# Patient Record
Sex: Male | Born: 1995 | Hispanic: No | Marital: Single | Smoking: Never smoker
Health system: Southern US, Community
[De-identification: ages and names within clinical notes are randomized; demographics above are authoritative.]

## PROBLEM LIST (undated history)

## (undated) DIAGNOSIS — S83241S Other tear of medial meniscus, current injury, right knee, sequela: Secondary | ICD-10-CM

## (undated) DIAGNOSIS — G8929 Other chronic pain: Secondary | ICD-10-CM

## (undated) DIAGNOSIS — M545 Low back pain, unspecified: Secondary | ICD-10-CM

## (undated) DIAGNOSIS — M25561 Pain in right knee: Secondary | ICD-10-CM

## (undated) DIAGNOSIS — S32059A Unspecified fracture of fifth lumbar vertebra, initial encounter for closed fracture: Secondary | ICD-10-CM

## (undated) DIAGNOSIS — M4300 Spondylolysis, site unspecified: Secondary | ICD-10-CM

## (undated) DIAGNOSIS — J45909 Unspecified asthma, uncomplicated: Secondary | ICD-10-CM

## (undated) HISTORY — DX: Other tear of medial meniscus, current injury, right knee, sequela: S83.241S

## (undated) HISTORY — DX: Unspecified fracture of fifth lumbar vertebra, initial encounter for closed fracture: S32.059A

## (undated) HISTORY — DX: Low back pain, unspecified: M54.50

## (undated) HISTORY — DX: Spondylolysis, site unspecified: M43.00

## (undated) HISTORY — DX: Pain in right knee: M25.561

## (undated) HISTORY — PX: KNEE SURGERY: SHX244

## (undated) HISTORY — DX: Other chronic pain: G89.29

## (undated) HISTORY — DX: Low back pain: M54.5

---
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
## 2017-02-22 ENCOUNTER — Ambulatory Visit (INDEPENDENT_AMBULATORY_CARE_PROVIDER_SITE_OTHER): Payer: Commercial Managed Care - PPO

## 2017-02-22 ENCOUNTER — Ambulatory Visit: Payer: Commercial Managed Care - PPO | Admitting: Physician Assistant

## 2017-02-22 ENCOUNTER — Encounter (INDEPENDENT_AMBULATORY_CARE_PROVIDER_SITE_OTHER): Payer: Self-pay

## 2017-02-22 ENCOUNTER — Encounter: Payer: Self-pay | Admitting: Physician Assistant

## 2017-02-22 VITALS — BP 119/76 | HR 58 | Resp 16 | Ht 69.0 in | Wt 203.0 lb

## 2017-02-22 DIAGNOSIS — Z7689 Persons encountering health services in other specified circumstances: Secondary | ICD-10-CM | POA: Diagnosis not present

## 2017-02-22 DIAGNOSIS — Z130 Encounter for screening for diseases of the blood and blood-forming organs and certain disorders involving the immune mechanism: Secondary | ICD-10-CM | POA: Diagnosis not present

## 2017-02-22 DIAGNOSIS — M25561 Pain in right knee: Secondary | ICD-10-CM | POA: Diagnosis not present

## 2017-02-22 DIAGNOSIS — Z87828 Personal history of other (healed) physical injury and trauma: Secondary | ICD-10-CM

## 2017-02-22 DIAGNOSIS — Z8781 Personal history of (healed) traumatic fracture: Secondary | ICD-10-CM

## 2017-02-22 DIAGNOSIS — G8929 Other chronic pain: Secondary | ICD-10-CM | POA: Insufficient documentation

## 2017-02-22 DIAGNOSIS — M545 Low back pain, unspecified: Secondary | ICD-10-CM | POA: Insufficient documentation

## 2017-02-22 DIAGNOSIS — Z131 Encounter for screening for diabetes mellitus: Secondary | ICD-10-CM

## 2017-02-22 DIAGNOSIS — Z13220 Encounter for screening for lipoid disorders: Secondary | ICD-10-CM

## 2017-02-22 DIAGNOSIS — Z8709 Personal history of other diseases of the respiratory system: Secondary | ICD-10-CM | POA: Diagnosis not present

## 2017-02-22 DIAGNOSIS — Z9889 Other specified postprocedural states: Secondary | ICD-10-CM | POA: Insufficient documentation

## 2017-02-22 DIAGNOSIS — Z23 Encounter for immunization: Secondary | ICD-10-CM | POA: Diagnosis not present

## 2017-02-22 DIAGNOSIS — M4300 Spondylolysis, site unspecified: Secondary | ICD-10-CM

## 2017-02-22 HISTORY — DX: Spondylolysis, site unspecified: M43.00

## 2017-02-22 HISTORY — DX: Other chronic pain: G89.29

## 2017-02-22 NOTE — Progress Notes (Signed)
HPI:                                                                Jack Schmitt is a 22 y.o. male who presents to [facility]: Primary Care Sports Medicine today to establish care  Current concerns: right knee pain  22 yo M with PMH of right ACL reconstruction (2014) and right synovectomy and medial meniscectomy (2015) presents with recurrent right anterior knee pain. Endorses occasional clicking and aching pain, mild, intermittent. Pain is worse when squatting free weights at the gym (approx. 185 pounds). Describes "feels like bone wants to pop out the front of both of knees."  Denies swelling/effusion or knee instability.  Has not tried any NSAID or treatment.  Depression screen PHQ 2/9 02/22/2017  Decreased Interest 0  Down, Depressed, Hopeless 0  PHQ - 2 Score 0    No flowsheet data found.    Past Medical History:  Diagnosis Date  . Acute medial meniscal tear, right, sequela   . Asthma   . Chronic low back pain   . L5 vertebral fracture [facility])    Past Surgical History:  Procedure Laterality Date  . KNEE ARTHROSCOPY WITH ANTERIOR CRUCIATE LIGAMENT (ACL) REPAIR Right 2014  . KNEE SURGERY     x 2    Social History   Tobacco Use  . Smoking status: Never Smoker  . Smokeless tobacco: Never Used  Substance Use Topics  . Alcohol use: No   family history includes HIV in his maternal grandfather.    ROS: negative except as noted in the HPI  Medications: No current outpatient medications on file.   No current facility-administered medications for this visit.    No Known Allergies     Objective:  BP 119/76   Pulse (!) 58   Resp 16   Ht 5\' 9"  (1.753 m)   Wt 203 lb (92.1 kg)   BMI 29.98 kg/m  Gen:  alert, not ill-appearing, no distress, appropriate for age, athletic build HEENT: head normocephalic without obvious abnormality, conjunctiva and cornea clear, trachea midline Pulm: Normal work of breathing, normal phonation Neuro: alert and  oriented x 3, no tremor MSK: right knee atraumatic, no effusion or edema, no crepitus, full active ROM, joint is stable; extremities atraumatic, normal gait and station Skin: intact, no rashes on exposed skin, no jaundice, no cyanosis Psych: well-groomed, cooperative, good eye contact, euthymic mood, affect mood-congruent, speech is articulate, and thought processes clear and goal-directed    No results found for this or any previous visit (from the past 72 hour(s)). No results found.    Assessment and Plan: 22 y.o. male with   1. Encounter to establish care - reviewed PMH, PSH, PFH, medications and allergies - reviewed health maintenance - Tdap due in March - influenza given in office today - negative depression screen  2. Chronic bilateral low back pain without sciatica - DG Lumbar Spine Complete  3. Chronic patellofemoral pain of right knee - rehab exercises daily - Naproxen prn for moderate pain - Ice/Heat - rest and avoid heavy lifting - follow-up with Sports Medicine - DG Knee Complete 4 Views Right; Future - DG Knee 1-2 Views Left; Future  4. History of asthma - asymptomatic for 10 years or more  5. Screening for blood disease - CBC  6. Screening for diabetes mellitus - Comprehensive metabolic panel  7. Screening for lipid disorders - Lipid Panel w/reflex Direct LDL  8. History of fracture of vertebra - reports L5 fracture in high school from a football injury  9. S/P ACL repair (right)   11. Need for immunization against influenza - Flu Vaccine QUAD 36+ mos IM     Patient education and anticipatory guidance given Patient agrees with treatment plan Follow-up as needed if symptoms worsen or fail to improve  Levonne Hubertharley E. Denzil Bristol PA-C

## 2017-02-22 NOTE — Patient Instructions (Addendum)
- Naproxen (Aleve) 1-2 capsules twice a day as needed for moderate pain - Ice x 20 minutes every 4 hours as needed - Follow-up with Sports Medicine  I have also ordered fasting labs. The lab is a walk-in open M-F 7:30a-4:30p (closed 12:30-1:30p). Nothing to eat or drink after midnight or at least 8 hours before your blood draw. You can have water and your medications.    Knee Pain, Adult Knee pain in adults is common. It can be caused by many things, including:  Arthritis.  A fluid-filled sac (cyst) or growth in your knee.  An infection in your knee.  An injury that will not heal.  Damage, swelling, or irritation of the tissues that support your knee.  Knee pain is usually not a sign of a serious problem. The pain may go away on its own with time and rest. If it does not, a health care provider may order tests to find the cause of the pain. These may include:  Imaging tests, such as an X-ray, MRI, or ultrasound.  Joint aspiration. In this test, fluid is removed from the knee.  Arthroscopy. In this test, a lighted tube is inserted into knee and an image is projected onto a TV screen.  A biopsy. In this test, a sample of tissue is removed from the body and studied under a microscope.  Follow these instructions at home: Pay attention to any changes in your symptoms. Take these actions to relieve your pain. Activity  Rest your knee.  Do not do things that cause pain or make pain worse.  Avoid high-impact activities or exercises, such as running, jumping rope, or doing jumping jacks. General instructions  Take over-the-counter and prescription medicines only as told by your health care provider.  Raise (elevate) your knee above the level of your heart when you are sitting or lying down.  Sleep with a pillow under your knee.  If directed, apply ice to the knee: ? Put ice in a plastic bag. ? Place a towel between your skin and the bag. ? Leave the ice on for 20 minutes, 2-3  times a day.  Ask your health care provider if you should wear an elastic knee support.  Lose weight if you are overweight. Extra weight can put pressure on your knee.  Do not use any products that contain nicotine or tobacco, such as cigarettes and e-cigarettes. Smoking may slow the healing of any bone and joint problems that you may have. If you need help quitting, ask your health care provider. Contact a health care provider if:  Your knee pain continues, changes, or gets worse.  You have a fever along with knee pain.  Your knee buckles or locks up.  Your knee swells, and the swelling becomes worse. Get help right away if:  Your knee feels warm to the touch.  You cannot move your knee.  You have severe pain in your knee.  You have chest pain.  You have trouble breathing. Summary  Knee pain in adults is common. It can be caused by many things, including, arthritis, infection, cysts, or injury.  Knee pain is usually not a sign of a serious problem, but if it does not go away, a health care provider may perform tests to know the cause of the pain.  Pay attention to any changes in your symptoms. Relieve your pain with rest, medicines, light activity, and use of ice.  Get help if your pain continues or becomes very severe, or  if your knee buckles or locks up, or if you have chest pain or trouble breathing. This information is not intended to replace advice given to you by your health care provider. Make sure you discuss any questions you have with your health care provider. Document Released: 10/29/2006 Document Revised: 12/23/2015 Document Reviewed: 12/23/2015 Elsevier Interactive Patient Education  [employer].

## 2017-02-24 ENCOUNTER — Encounter: Payer: Self-pay | Admitting: Physician Assistant

## 2017-02-24 NOTE — Progress Notes (Signed)
Jack NovemberMike,  Your knee x-ray looks great.  You have a pars defect at L5, which is a stress fracture. This is the source of your low back pain.  I would definitely avoid dead lifts, squats and any weight lifting where you are extending/hyper-extending your back. Avoid contact sports. Keep your follow-up with Sports Medicine.  Best, Vinetta Bergamoharley

## 2017-03-01 ENCOUNTER — Ambulatory Visit (INDEPENDENT_AMBULATORY_CARE_PROVIDER_SITE_OTHER): Payer: Commercial Managed Care - PPO | Admitting: Sports Medicine

## 2017-03-01 ENCOUNTER — Encounter: Payer: Self-pay | Admitting: Sports Medicine

## 2017-03-01 DIAGNOSIS — M25561 Pain in right knee: Secondary | ICD-10-CM

## 2017-03-01 DIAGNOSIS — Z9889 Other specified postprocedural states: Secondary | ICD-10-CM

## 2017-03-01 MED ORDER — IBUPROFEN 800 MG PO TABS: 800.0000 mg | ORAL_TABLET | Freq: Three times a day (TID) | ORAL | 2 refills | Status: DC | PRN

## 2017-03-01 NOTE — Progress Notes (Signed)
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  He was pain-free after that, more recently he is noted insidious onset of pain, anterior medially, moderate, persistent without radiation, no mechanical symptoms, no history of trauma, really no swelling.  He does note a sensation of joint instability.  I reviewed the past medical history, family history, social history, surgical history, and allergies today and no changes were needed.  Please see the problem list section below in epic for further details.  Past Medical History: Past Medical History:  Diagnosis Date  . Acute medial meniscal tear, right, sequela   . Asthma   . Chronic low back pain   . Chronic patellofemoral pain of right knee 02/22/2017  . L5 vertebral fracture (HCC)   . 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 KNEE ARTHROSCOPY WITH ANTERIOR CRUCIATE LIGAMENT (ACL) REPAIR Right 2014  . KNEE SURGERY     x 2    Social History: Social History   Socioeconomic History  . Marital status: Single    Spouse name: None  . Number of children: None  . Years of education: None  . Highest education level: None  Social Needs  . Financial resource strain: None  . Food insecurity - worry: None  . Food insecurity - inability: None  . Transportation needs - medical: None  . Transportation needs - non-medical: None  Occupational History  . None  Tobacco Use  . Smoking status: Never Smoker  . Smokeless tobacco: Never Used  Substance and Sexual Activity  . Alcohol use: No  . Drug use: Yes    Frequency: 7.0 times per week    Types:  Marijuana  . Sexual activity: None  Other Topics Concern  . None  Social History Narrative  . None   Family History: Family History  Problem Relation Age of Onset  . HIV Maternal Grandfather   . Diabetes Maternal Grandfather   . Valvular heart disease Maternal Grandfather   . Hypertension Maternal Grandfather   . Hyperlipidemia Paternal Grandfather   . Hypertension Paternal Grandfather    Allergies: No Known Allergies Medications: See med rec.  Review of Systems: No headache, visual changes, nausea, vomiting, diarrhea, constipation, dizziness, abdominal pain, skin rash, fevers, chills, night sweats, weight loss, swollen lymph nodes, body aches, joint swelling, muscle aches, chest pain, shortness of breath, mood changes, visual or auditory hallucinations.   Objective:   General: Well Developed, well nourished, and in no acute distress.  Neuro:  Extra-ocular muscles intact, able to move all 4 extremities, sensation grossly intact.  Deep tendon reflexes tested were normal. Psych: Alert and oriented, mood congruent with affect. ENT:  Ears and nose appear unremarkable.  Hearing grossly normal. Neck: Unremarkable overall appearance, trachea midline.  No visible thyroid enlargement. Eyes: Conjunctivae and lids appear unremarkable.  Pupils equal and round. Skin: Warm and dry, no rashes noted.  Cardiovascular: Pulses palpable, no extremity edema. Right knee: Overall unremarkable to inspection, well-healed arthroscopy portals ROM normal in flexion and extension and lower leg rotation. Good endpoints to the PCL, LCL, MCL, I am unable to appreciate the  ACL endpoint, positive Lachman's test, positive anterior drawer sign. No pain with terminal flexion. Negative Mcmurray's and provocative meniscal tests. Non painful patellar compression. Patellar and quadriceps tendons unremarkable. Hamstring and quadriceps strength is normal.  X-rays reviewed and are unremarkable with the exception of  classic signs of ACL reconstruction anchors.  Impression and Recommendations:   This case required medical decision making of moderate complexity.  S/P ACL repair History of ACL reconstruction with hamstring autograft. This was followed by partial meniscectomy in 2015. Unfortunately has persistent pain at the anterior medial aspect of the joint as well as a sensation of joint instability, I am unable to adequately appreciate his ACL. Because of this it is crucial that we determine if his graft is still intact before proceeding any further. Adding ibuprofen 800 in the meantime. X-rays are unremarkable with the exception of the signs of an old ACL reconstruction. ___________________________________________ Ihor Austinhomas J. Benjamin Stainhekkekandam, M.D., ABFM., CAQSM. Primary Care and Sports Medicine [facility]  Adjunct Instructor of Family Medicine  [employer]

## 2017-03-01 NOTE — Assessment & Plan Note (Signed)
History of ACL reconstruction with hamstring autograft. This was followed by partial meniscectomy in 2015. Unfortunately has persistent pain at the anterior medial aspect of the joint as well as a sensation of joint instability, I am unable to adequately appreciate his ACL. Because of this it is crucial that we determine if his graft is still intact before proceeding any further. Adding ibuprofen 800 in the meantime. X-rays are unremarkable with the exception of the signs of an old ACL reconstruction.

## 2017-03-11 ENCOUNTER — Ambulatory Visit (INDEPENDENT_AMBULATORY_CARE_PROVIDER_SITE_OTHER): Payer: Commercial Managed Care - PPO

## 2017-03-11 DIAGNOSIS — Z9889 Other specified postprocedural states: Secondary | ICD-10-CM

## 2017-03-11 DIAGNOSIS — Y93B3 Activity, free weights: Secondary | ICD-10-CM | POA: Diagnosis not present

## 2017-03-11 DIAGNOSIS — X500XXD Overexertion from strenuous movement or load, subsequent encounter: Secondary | ICD-10-CM | POA: Diagnosis not present

## 2017-03-11 DIAGNOSIS — S83231D Complex tear of medial meniscus, current injury, right knee, subsequent encounter: Secondary | ICD-10-CM | POA: Diagnosis not present

## 2017-03-11 DIAGNOSIS — M25561 Pain in right knee: Secondary | ICD-10-CM

## 2017-03-11 LAB — COMPREHENSIVE METABOLIC PANEL
AG RATIO: 1.7 (calc) (ref 1.0–2.5)
ALKALINE PHOSPHATASE (APISO): 40 U/L (ref 40–115)
ALT: 25 U/L (ref 9–46)
AST: 19 U/L (ref 10–40)
Albumin: 4.6 g/dL (ref 3.6–5.1)
BILIRUBIN TOTAL: 0.5 mg/dL (ref 0.2–1.2)
BUN: 13 mg/dL (ref 7–25)
CALCIUM: 9.9 mg/dL (ref 8.6–10.3)
CO2: 28 mmol/L (ref 20–32)
Chloride: 101 mmol/L (ref 98–110)
Creat: 1.14 mg/dL (ref 0.60–1.35)
Globulin: 2.7 g/dL (calc) (ref 1.9–3.7)
Glucose, Bld: 109 mg/dL — ABNORMAL HIGH (ref 65–99)
POTASSIUM: 3.8 mmol/L (ref 3.5–5.3)
Sodium: 138 mmol/L (ref 135–146)
Total Protein: 7.3 g/dL (ref 6.1–8.1)

## 2017-03-11 LAB — CBC
HEMATOCRIT: 45.2 % (ref 38.5–50.0)
HEMOGLOBIN: 15.7 g/dL (ref 13.2–17.1)
MCH: 30.9 pg (ref 27.0–33.0)
MCHC: 34.7 g/dL (ref 32.0–36.0)
MCV: 89 fL (ref 80.0–100.0)
MPV: 10.1 fL (ref 7.5–12.5)
Platelets: 202 10*3/uL (ref 140–400)
RBC: 5.08 10*6/uL (ref 4.20–5.80)
RDW: 12 % (ref 11.0–15.0)
WBC: 5.7 10*3/uL (ref 3.8–10.8)

## 2017-03-11 LAB — LIPID PANEL W/REFLEX DIRECT LDL
CHOLESTEROL: 166 mg/dL (ref <200)
HDL: 48 mg/dL (ref >40)
LDL Cholesterol (Calc): 104 mg/dL (calc) — ABNORMAL HIGH
Non-HDL Cholesterol (Calc): 118 mg/dL (calc) (ref <130)
Total CHOL/HDL Ratio: 3.5 (calc) (ref <5.0)
Triglycerides: 57 mg/dL (ref <150)

## 2017-03-12 NOTE — Progress Notes (Signed)
Hi Jack Schmitt,  Your labs look good overall - normal kidney function and liver enzymes - cholesterol in a healthy range - normal blood counts  Your blood sugar was mildly elevated. If you were not fasting, we would expect this. We should recheck a fasting glucose at your next routine follow-up appointment or annual physical. Otherwise, I am not concerned about it. It is not in a diabetic range.  Best, Vinetta Bergamoharley

## 2017-03-19 ENCOUNTER — Encounter: Payer: Self-pay | Admitting: Sports Medicine

## 2017-03-19 ENCOUNTER — Ambulatory Visit (INDEPENDENT_AMBULATORY_CARE_PROVIDER_SITE_OTHER): Payer: Commercial Managed Care - PPO | Admitting: Sports Medicine

## 2017-03-19 DIAGNOSIS — Z9889 Other specified postprocedural states: Secondary | ICD-10-CM

## 2017-03-19 NOTE — Progress Notes (Signed)
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  He did have some meniscal tearing.  After use of ibuprofen for a couple of weeks his symptoms have improved considerably, he is not sensing any more pain or instability, but he has not yet been back into the gym.  I reviewed the past medical history, family history, social history, surgical history, and allergies today and no changes were needed.  Please see the problem list section below in epic for further details.  Past Medical History: Past Medical History:  Diagnosis Date  . Acute medial meniscal tear, right, sequela   . Asthma   . Chronic low back pain   . Chronic patellofemoral pain of right knee 02/22/2017  . L5 vertebral fracture (HCC)   . 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 KNEE ARTHROSCOPY WITH ANTERIOR CRUCIATE LIGAMENT (ACL) REPAIR Right 2014  . KNEE SURGERY     x 2    Social History: Social History   Socioeconomic History  . Marital status: Single    Spouse name: None  . Number of children: None  . Years of education: None  . Highest education level: None  Social Needs  . Financial resource strain: None  . Food insecurity - worry: None  . Food insecurity - inability: None  . Transportation needs - medical: None  . Transportation needs - non-medical: None  Occupational History  . None  Tobacco Use  . Smoking status: Never Smoker  . Smokeless tobacco: Never Used  Substance and Sexual Activity  . Alcohol use: No  . Drug use: Yes    Frequency: 7.0 times per week    Types: Marijuana  . Sexual activity: None  Other Topics Concern  . None  Social History Narrative  . None   Family History: Family History  Problem Relation Age of Onset  .  HIV Maternal Grandfather   . Diabetes Maternal Grandfather   . Valvular heart disease Maternal Grandfather   . Hypertension Maternal Grandfather   . Hyperlipidemia Paternal Grandfather   . Hypertension Paternal Grandfather    Allergies: No Known Allergies Medications: See med rec.  Review of Systems: No fevers, chills, night sweats, weight loss, chest pain, or shortness of breath.   Objective:    General: Well Developed, well nourished, and in no acute distress.  Neuro: Alert and oriented x3, extra-ocular muscles intact, sensation grossly intact.  HEENT: Normocephalic, atraumatic, pupils equal round reactive to light, neck supple, no masses, no lymphadenopathy, thyroid nonpalpable.  Skin: Warm and dry, no rashes. Cardiac: Regular rate and rhythm, no murmurs rubs or gallops, no lower extremity edema.  Respiratory: Clear to auscultation bilaterally. Not using accessory muscles, speaking in full sentences. Right Knee: Normal to inspection with no erythema or effusion or obvious bony abnormalities. Palpation normal with no warmth or joint line tenderness or patellar tenderness or condyle tenderness. ROM normal in flexion and extension and lower leg rotation. Ligaments with solid consistent endpoints including ACL, PCL, LCL, MCL. Negative Mcmurray's and provocative meniscal tests. Non painful patellar compression. Patellar and quadriceps tendons unremarkable. Hamstring and quadriceps strength is normal.  Impression and Recommendations:    S/P ACL repair History of ACL reconstruction, graft is still intact on recent  MRI, he does have some complex meniscal tearing. Pain is overall better, stability is better, continue ibuprofen, I think he can get back into squatting in the gym. Meniscal rehab exercises given, he will touch base with me on my chart after a couple of weeks to let me know how things are going. Certainly we could do an injection to calm things down if  needed. ___________________________________________ Ihor Austinhomas J. Benjamin Stainhekkekandam, M.D., ABFM., CAQSM. Primary Care and Sports Medicine [facility]  Adjunct Instructor of Family Medicine  [employer]

## 2017-03-19 NOTE — Assessment & Plan Note (Signed)
History of ACL reconstruction, graft is still intact on recent MRI, he does have some complex meniscal tearing. Pain is overall better, stability is better, continue ibuprofen, I think he can get back into squatting in the gym. Meniscal rehab exercises given, he will touch base with me on my chart after a couple of weeks to let me know how things are going. Certainly we could do an injection to calm things down if needed.

## 2017-07-02 ENCOUNTER — Encounter: Payer: Self-pay | Admitting: Physician Assistant

## 2017-07-02 ENCOUNTER — Ambulatory Visit (INDEPENDENT_AMBULATORY_CARE_PROVIDER_SITE_OTHER): Payer: Commercial Managed Care - PPO | Admitting: Physician Assistant

## 2017-07-02 VITALS — BP 135/80 | HR 51 | Temp 98.4°F | Wt 203.0 lb

## 2017-07-02 DIAGNOSIS — J029 Acute pharyngitis, unspecified: Secondary | ICD-10-CM

## 2017-07-02 MED ORDER — PENICILLIN G BENZATHINE 1200000 UNIT/2ML IM SUSP
1.2000 10*6.[IU] | Freq: Once | INTRAMUSCULAR | Status: AC
  Administered 2017-07-02: 1.2 10*6.[IU] via INTRAMUSCULAR

## 2017-07-02 NOTE — Patient Instructions (Addendum)
For sore throat: - Tylenol 1000mg every 8 hours as needed for throat pain. Alternate with Ibuprofen 600mg every 6 hours - Cepacol throat lozenges and/or Chloraseptic spray - Warm salt water gargles   Pharyngitis Pharyngitis is redness, pain, and swelling (inflammation) of the throat (pharynx). It is a very common cause of sore throat. Pharyngitis can be caused by a bacteria, but it is usually caused by a virus. Most cases of pharyngitis get better on their own without treatment. What are the causes? This condition may be caused by:  Infection by viruses (viral). Viral pharyngitis spreads from person to person (is contagious) through coughing, sneezing, and sharing of personal items or utensils such as cups, forks, spoons, and toothbrushes.  Infection by bacteria (bacterial). Bacterial pharyngitis may be spread by touching the nose or face after coming in contact with the bacteria, or through more intimate contact, such as kissing.  Allergies. Allergies can cause buildup of mucus in the throat (post-nasal drip), leading to inflammation and irritation. Allergies can also cause blocked nasal passages, forcing breathing through the mouth, which dries and irritates the throat.  What increases the risk? You are more likely to develop this condition if:  You are 5-24 years old.  You are exposed to crowded environments such as daycare, school, or dormitory living.  You live in a cold climate.  You have a weakened disease-fighting (immune) system.  What are the signs or symptoms? Symptoms of this condition vary by the cause (viral, bacterial, or allergies) and can include:  Sore throat.  Fatigue.  Low-grade fever.  Headache.  Joint pain and muscle aches.  Skin rashes.  Swollen glands in the throat (lymph nodes).  Plaque-like film on the throat or tonsils. This is often a symptom of bacterial pharyngitis.  Vomiting.  Stuffy nose (nasal congestion).  Cough.  Red, itchy eyes  (conjunctivitis).  Loss of appetite.  How is this diagnosed? This condition is often diagnosed based on your medical history and a physical exam. Your health care provider will ask you questions about your illness and your symptoms. A swab of your throat may be done to check for bacteria (rapid strep test). Other lab tests may also be done, depending on the suspected cause, but these are rare. How is this treated? This condition usually gets better in 3-4 days without medicine. Bacterial pharyngitis may be treated with antibiotic medicines. Follow these instructions at home:  Take over-the-counter and prescription medicines only as told by your health care provider. ? If you were prescribed an antibiotic medicine, take it as told by your health care provider. Do not stop taking the antibiotic even if you start to feel better. ? Do not give children aspirin because of the association with Reye syndrome.  Drink enough water and fluids to keep your urine clear or pale yellow.  Get a lot of rest.  Gargle with a salt-water mixture 3-4 times a day or as needed. To make a salt-water mixture, completely dissolve -1 tsp of salt in 1 cup of warm water.  If your health care provider approves, you may use throat lozenges or sprays to soothe your throat. Contact a health care provider if:  You have large, tender lumps in your neck.  You have a rash.  You cough up green, yellow-brown, or bloody spit. Get help right away if:  Your neck becomes stiff.  You drool or are unable to swallow liquids.  You cannot drink or take medicines without vomiting.  You have severe   pain that does not go away, even after you take medicine.  You have trouble breathing, and it is not caused by a stuffy nose.  You have new pain and swelling in your joints such as the knees, ankles, wrists, or elbows. Summary  Pharyngitis is redness, pain, and swelling (inflammation) of the throat (pharynx).  While  pharyngitis can be caused by a bacteria, the most common causes are viral.  Most cases of pharyngitis get better on their own without treatment.  Bacterial pharyngitis is treated with antibiotic medicines. This information is not intended to replace advice given to you by your health care provider. Make sure you discuss any questions you have with your health care provider. Document Released: 01/01/2005 Document Revised: 02/07/2016 Document Reviewed: 02/07/2016 Elsevier Interactive Patient Education  2018 Elsevier Inc.  

## 2017-07-02 NOTE — Progress Notes (Signed)
HPI:                                                                Estanislado PandyMicheal Tarleton is a 22 y.o. male who [facility] Care Sports Medicine today for sore throat  Sore Throat   This is a new problem. The current episode started in the past 7 days. The problem has been gradually worsening. Neither side of throat is experiencing more pain than the other. There has been no fever. The pain is moderate. Associated symptoms include ear pain (right ear ache). Pertinent negatives include no headaches, hoarse voice, swollen glands, trouble swallowing or vomiting.     No flowsheet data found.    Past Medical History:  Diagnosis Date  . Acute medial meniscal tear, right, sequela   . Asthma   . Chronic low back pain   . Chronic patellofemoral pain of right knee 02/22/2017  . L5 vertebral fracture (HCC)   . 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 KNEE ARTHROSCOPY WITH ANTERIOR CRUCIATE LIGAMENT (ACL) REPAIR Right 2014  . KNEE SURGERY     x 2    Social History   Tobacco Use  . Smoking status: Never Smoker  . Smokeless tobacco: Never Used  Substance Use Topics  . Alcohol use: No   family history includes Diabetes in his maternal grandfather; HIV in his maternal grandfather; Hyperlipidemia in his paternal grandfather; Hypertension in his maternal grandfather and paternal grandfather; Valvular heart disease in his maternal grandfather.    ROS: negative except as noted in the HPI  Medications: Current Outpatient Medications  Medication Sig Dispense Refill  . ibuprofen (ADVIL,MOTRIN) 800 MG tablet Take 1 tablet (800 mg total) by mouth every 8 (eight) hours as needed. 90 tablet 2   No current facility-administered medications for this visit.    No Known Allergies     Objective:  BP 135/80   Pulse (!) 51   Temp 98.4 F (36.9 C) (Oral)   Wt 203 lb (92.1 kg)   BMI 29.98  kg/m  Gen:  alert, not ill-appearing, no distress, appropriate for age HEENT: head normocephalic without obvious abnormality, conjunctiva and cornea clear, TM's pearly gray and semi-transparent, large exudate on right tonsil, orpharynx with erythema, no edema, uvula midline, no cervical adenopathy, trachea midline Pulm: Normal work of breathing, normal phonation, clear to auscultation bilaterally, no wheezes, rales or rhonchi CV: Normal rate, regular rhythm, s1 and s2 distinct, no murmurs, clicks or rubs  Neuro: alert and oriented x 3, no tremor MSK: extremities atraumatic, normal gait and station Skin: intact, no rashes on exposed skin, no jaundice, no cyanosis  No results found for this or any previous visit (from the past 72 hour(s)). No results found.    Assessment and Plan: 22 y.o. male with   Pharyngitis, unspecified etiology - Plan: Culture, Group A Strep, penicillin g benzathine (BICILLIN LA) 1200000 UNIT/2ML injection 1.2 Million Units, Culture, Group A Strep - no POC Strep A tests available today. Centor score 2.  - Throat culture pending - Treating empirically with Bicillin - counseled on symptomatic care  Patient education and anticipatory guidance given Patient agrees with treatment plan Follow-up as  needed if symptoms worsen or fail to improve  Darlyne Russian PA-C

## 2017-07-04 LAB — CULTURE, GROUP A STREP
MICRO NUMBER:: 90731538
SPECIMEN QUALITY:: ADEQUATE

## 2017-12-11 ENCOUNTER — Ambulatory Visit (INDEPENDENT_AMBULATORY_CARE_PROVIDER_SITE_OTHER): Payer: Commercial Managed Care - PPO | Admitting: Physician Assistant

## 2017-12-11 DIAGNOSIS — Z23 Encounter for immunization: Secondary | ICD-10-CM | POA: Diagnosis not present

## 2018-01-13 ENCOUNTER — Ambulatory Visit (INDEPENDENT_AMBULATORY_CARE_PROVIDER_SITE_OTHER): Payer: Commercial Managed Care - PPO | Admitting: Physician Assistant

## 2018-01-13 ENCOUNTER — Encounter: Payer: Self-pay | Admitting: Physician Assistant

## 2018-01-13 VITALS — BP 138/83 | HR 71 | Wt 222.0 lb

## 2018-01-13 DIAGNOSIS — S838X1D Sprain of other specified parts of right knee, subsequent encounter: Secondary | ICD-10-CM | POA: Insufficient documentation

## 2018-01-13 DIAGNOSIS — L710 Perioral dermatitis: Secondary | ICD-10-CM | POA: Diagnosis not present

## 2018-01-13 MED ORDER — ERYTHROMYCIN 2 % EX GEL: CUTANEOUS | 2 refills | Status: DC

## 2018-01-13 MED ORDER — NAPROXEN 500 MG PO TABS: 500.0000 mg | ORAL_TABLET | Freq: Two times a day (BID) | ORAL | 0 refills | Status: DC | PRN

## 2018-01-13 NOTE — Progress Notes (Signed)
HPI:                                                                Estanislado PandyMicheal Boroff is a 22 y.o. male [facility] today for right knee pain  Chronic right knee pain for over 6 months. Reports he is having a lot of pain now just with standing, symptoms are gradually worsening. Taking Advil nightly and doing rehab exercises daily. Wearing knee braces at work.  MR Right knee from 02/2017 showing Complex tears involving the posterior horn of the medial meniscus peripherally with intrameniscal and parameniscal cysts Requesting referral to [facility], Dr. Autumn MessingHowe  He also reports a chronic rash around his lower jaw that has been present since childhood.  It occasionally flares up and becomes very red and swollen,, sometimes scaly.   Past Medical History:  Diagnosis Date  . Acute medial meniscal tear, right, sequela   . Asthma   . Chronic low back pain   . Chronic patellofemoral pain of right knee 02/22/2017  . L5 vertebral fracture (HCC)   . 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 KNEE ARTHROSCOPY WITH ANTERIOR CRUCIATE LIGAMENT (ACL) REPAIR Right 2014  . KNEE SURGERY     x 2    Social History   Tobacco Use  . Smoking status: Never Smoker  . Smokeless tobacco: Never Used  Substance Use Topics  . Alcohol use: No   family history includes Diabetes in his maternal grandfather; HIV in his maternal grandfather; Hyperlipidemia in his paternal grandfather; Hypertension in his maternal grandfather and paternal grandfather; Valvular heart disease in his maternal grandfather.    ROS: negative except as noted in the HPI  Medications: Current Outpatient Medications  Medication Sig Dispense Refill  . ibuprofen (ADVIL,MOTRIN) 800 MG tablet Take 1 tablet (800 mg total) by mouth every 8 (eight) hours as needed. 90 tablet 2  . erythromycin with ethanol  (EMGEL) 2 % gel Apply topically 2 (two) times a week. 30 g 2  . naproxen (NAPROSYN) 500 MG tablet Take 1 tablet (500 mg total) by mouth every 12 (twelve) hours as needed. 60 tablet 0   No current facility-administered medications for this visit.    No Known Allergies     Objective:  BP 138/83   Pulse 71   Wt 222 lb (100.7 kg)   BMI 32.78 kg/m  Gen:  alert, not ill-appearing, no distress, appropriate for age HEENT: head normocephalic without obvious abnormality, conjunctiva and cornea clear, trachea midline Pulm: Normal work of breathing, normal phonation Neuro: alert and oriented x 3, no tremor MSK: extremities atraumatic, normal gait and station Skin: lower perioral area there is a faint red patch  Study Result   CLINICAL DATA:  Injured knee exercising several months ago. Persistent knee pain.  EXAM: MRI OF THE RIGHT KNEE WITHOUT CONTRAST  TECHNIQUE: Multiplanar, multisequence MR imaging of the knee was performed. No intravenous contrast was administered.  COMPARISON:  Radiographs 02/22/2017  FINDINGS: MENISCI  Medial meniscus: Complex peripheral tears involving the posterior horn with intrameniscal and parameniscal cysts.  Lateral meniscus:  Intact  LIGAMENTS  Cruciates: The ACL graft is intact. No roof or  condylar impingement. The PCL is intact.  Collaterals:  Intact  CARTILAGE  Patellofemoral:  Normal  Medial:  Normal  Lateral:  Normal  Joint:  No joint effusion.  Popliteal Fossa:  No popliteal mass or Baker's cyst.  Extensor Mechanism: The patella retinacular structures are intact and the quadriceps and patellar tendons are intact.  Bones: No acute bony findings. No bone contusion, marrow edema or osteochondral abnormality.  Other: Normal knee musculature.  IMPRESSION: 1. Surgical changes from ACL reconstruction. The ACL graft is intact and the PCL and collateral ligaments are intact. 2. Complex tears involving the  posterior horn of the medial meniscus peripherally with intrameniscal and parameniscal cysts. 3. No acute bony findings. 4. No joint effusion or Baker's cyst.   Electronically Signed   By: Rudie MeyerP.  Gallerani M.D.   On: 03/12/2017 07:50     No results found for this or any previous visit (from the past 72 hour(s)). No results found.    Assessment and Plan: 22 y.o. male with   .Lillian was seen today for knee pain.  Diagnoses and all orders for this visit:  Meniscal injury, right, subsequent encounter -     Ambulatory referral to Orthopedic Surgery -     naproxen (NAPROSYN) 500 MG tablet; Take 1 tablet (500 mg total) by mouth every 12 (twelve) hours as needed.  Perioral dermatitis -     erythromycin with ethanol (EMGEL) 2 % gel; Apply topically 2 (two) times a week.   Right meniscal injury Has failed greater than 6 weeks of conservative management He declines steroid injection Referral placed to orthopedic surgery Switching from ibuprofen to Naprosyn  Perioral dermatitis Counseled on general measures Erythromycin gel twice weekly Encouraged him to contact our office for flareups so that he can be treated with oral antibiotics as needed    Patient education and anticipatory guidance given Patient agrees with treatment plan Follow-up as needed if symptoms worsen or fail to improve  Levonne Hubertharley E. Cummings PA-C

## 2018-01-13 NOTE — Patient Instructions (Signed)
Perioral Dermatitis  What is the cause of periorificial or periorificial dermatitis? The exact cause of periorificial is not understood. Periorificial dermatitis may be related to:  Epidermal barrier dysfunction Activation of the innate immune system Altered cutaneous microflora Follicular fusiform bacteria Unlike seborrhoeic dermatitis, which can affect similar areas of the face, malassezia yeasts are not involved in periorificial dermatitis.  Periorificial dermatitis may be induced by:  Topical steroids, whether applied deliberately to facial skin or inadvertently Nasal steroids, steroid inhalers, and oral steroids Cosmetic creams, make-ups and sunscreens Fluorinated toothpaste Neglecting to wash the face Hormonal changes and/or oral contraceptives What are the clinical features of periorificial dermatitis? The characteristics of facial periorificial dermatitis are:  Unilateral or bilateral eruption on the chin, upper lip and eyelids in perioral, perinasal and periocular distribution Sparing of the skin bordering the lips (which then appears pale), eyelids, nostrils Clusters of 1-2 mm erythematous papules or papulopustules Dry and flaky skin surface Burning irritation In contrast to steroid-induced rosacea, periorificial dermatitis spares the cheeks and forehead.  Genital periorificial dermatitis has a similar clinical appearance. It involves the skin on and around labia majora (in females), scrotum (in males) and anus.  Complications of periorificial dermatitis Granulomatous periorificial dermatitis is a variant of periorificial dermatitis that presents with persistent yellowish papules. It occurs mainly in young children and nearly always follows the use of a corticosteroid. There is a granulomatous perifollicular infiltrate on histopathology.  Steroid rosacea presents with steroid-induced, large facial papules, papulopustules and telangiectasia on the mid-face, including  forehead and cheeks.  Rebound flare of severe periorificial dermatitis may occur after abrupt cessation of application of potent topical steroid to facial skin.  How is periorificial dermatitis diagnosed? The presentation of periorificial dermatitis is usually typical, so clinical diagnosis is usually straightforward. There are no specific tests.  Skin biopsy shows follicular and perivascular chronic inflammation similar to rosacea.  What is the treatment for perioral dermatitis? Periorificial dermatitis responds well to treatment, although it may take several weeks before there is a noticeable improvement.  General measures Discontinue applying all face creams including topical steroids, cosmetics and sunscreens (zero therapy). Consider a slower withdrawal from topical steroid/face creams if there is a severe flare after steroid cessation. Temporarily, replace it by a less potent or less occlusive cream or apply it less and less frequently until it is no longer required. Wash the face with warm water alone while the rash is present. When it has cleared up, use a non-soap bar or liquid cleanser if you wish. Choose a liquid or gel sunscreen. Topical therapy Topical therapy is used to treat mild periorificial dermatitis. Choices include:  Erythromycin Clindamycin Metronidazole Pimecrolimus Azelaic acid Oral therapy In more severe cases, a course of oral antibiotics may be prescribed for 6-12 weeks.  Most often, a tetracycline such as doxycycline is recommended. A sub-antimicrobial dose may be sufficient. Oral erythromycin is used during pregnancy and in pre-pubertal children. Oral low-dose isotretinoin may be used if antibiotics are ineffective or contraindicated. How can periorificial dermatitis be prevented? Periorificial dermatitis can generally be prevented by the avoidance of topical steroids and occlusive face creams. When topical steroids are necessary to treat an inflammatory  facial rash, they should be applied accurately to the affected area, no more than once daily in the lowest effective potency, and discontinued as soon as the rash responds.  What is the outlook for periorificial dermatitis? Periorificial dermatitis sometimes recurs when the antibiotics are discontinued, or at a later date. The same treatment can  be used again.

## 2018-10-02 ENCOUNTER — Ambulatory Visit (INDEPENDENT_AMBULATORY_CARE_PROVIDER_SITE_OTHER): Payer: Commercial Managed Care - PPO | Admitting: Sports Medicine

## 2018-10-02 ENCOUNTER — Other Ambulatory Visit: Payer: Self-pay

## 2018-10-02 DIAGNOSIS — R509 Fever, unspecified: Secondary | ICD-10-CM

## 2018-10-02 DIAGNOSIS — M791 Myalgia, unspecified site: Secondary | ICD-10-CM | POA: Diagnosis not present

## 2018-10-02 DIAGNOSIS — U071 COVID-19: Secondary | ICD-10-CM

## 2018-10-02 DIAGNOSIS — Z20822 Contact with and (suspected) exposure to covid-19: Secondary | ICD-10-CM

## 2018-10-02 DIAGNOSIS — R05 Cough: Secondary | ICD-10-CM

## 2018-10-02 MED ORDER — DEXAMETHASONE 4 MG PO TABS: 4.0000 mg | ORAL_TABLET | Freq: Three times a day (TID) | ORAL | 0 refills | Status: DC

## 2018-10-02 NOTE — Assessment & Plan Note (Addendum)
Suspect COVID-19, clinically stable, no respiratory distress. Adding Decadron, he will go to the testing site for COVID swab. Details given on self quarantine and mask wear. He should follow-up with me in a virtual visit in 2 weeks.  COVID test did come back positive.  He will quarantine for 14 days.

## 2018-10-02 NOTE — Progress Notes (Addendum)
Virtual Visit via WebEx/MyChart   I connected with  Jack PandyMicheal Schmitt  on 10/06/18 via WebEx/MyChart/Doximity Video and verified that I am speaking with the correct person using two identifiers.   I discussed the limitations, risks, security and privacy concerns of performing an evaluation and management service by WebEx/MyChart/Doximity Video, including the higher likelihood of inaccurate diagnosis and treatment, and the availability of in person appointments.  We also discussed the likely need of an additional face to face encounter for complete and high quality delivery of care.  I also discussed with the patient that there may be a patient responsible charge related to this service. The patient expressed understanding and wishes to proceed.  Provider location is either at home or medical facility. Patient location is at their home, different from provider location. People involved in care of the patient during this telehealth encounter were myself, my nurse/medical assistant, and my front office/scheduling team member.  Subjective:    CC: Feeling sick  HPI: Casimiro NeedleMichael is a very pleasant 23 year old male, for the past couple of days he has roommate have been a bit sick, he has a fever up to 102 Fahrenheit, muscle aches, minimal cough, no anosmia or ageusia.  No shortness of breath, no chest pain.  I reviewed the past medical history, family history, social history, surgical history, and allergies today and no changes were needed.  Please see the problem list section below in epic for further details.  Past Medical History: Past Medical History:  Diagnosis Date  . Acute medial meniscal tear, right, sequela   . Asthma   . Chronic low back pain   . Chronic patellofemoral pain of right knee 02/22/2017  . L5 vertebral fracture (HCC)   . 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 KNEE ARTHROSCOPY  WITH ANTERIOR CRUCIATE LIGAMENT (ACL) REPAIR Right 2014  . KNEE SURGERY     x 2    Social History: Social History   Socioeconomic History  . Marital status: Single    Spouse name: Not on file  . Number of children: Not on file  . Years of education: Not on file  . Highest education level: Not on file  Occupational History  . Not on file  Social Needs  . Financial resource strain: Not on file  . Food insecurity    Worry: Not on file    Inability: Not on file  . Transportation needs    Medical: Not on file    Non-medical: Not on file  Tobacco Use  . Smoking status: Never Smoker  . Smokeless tobacco: Never Used  Substance and Sexual Activity  . Alcohol use: No  . Drug use: Yes    Frequency: 7.0 times per week    Types: Marijuana  . Sexual activity: Not on file  Lifestyle  . Physical activity    Days per week: Not on file    Minutes per session: Not on file  . Stress: Not on file  Relationships  . Social Musicianconnections    Talks on phone: Not on file    Gets together: Not on file    Attends religious service: Not on file    Active member of club or organization: Not on file    Attends meetings of clubs or organizations: Not on file    Relationship status: Not on file  Other Topics Concern  . Not on file  Social History  Narrative  . Not on file   Family History: Family History  Problem Relation Age of Onset  . HIV Maternal Grandfather   . Diabetes Maternal Grandfather   . Valvular heart disease Maternal Grandfather   . Hypertension Maternal Grandfather   . Hyperlipidemia Paternal Grandfather   . Hypertension Paternal Grandfather    Allergies: No Known Allergies Medications: See med rec.  Review of Systems: No fevers, chills, night sweats, weight loss, chest pain, or shortness of breath.   Objective:    General: Speaking full sentences, no audible heavy breathing.  Sounds alert and appropriately interactive.  Appears well.  Face symmetric.  Extraocular  movements intact.  Pupils equal and round.  No nasal flaring or accessory muscle use visualized.  No other physical exam performed due to the non-physical nature of this visit.  Impression and Recommendations:    COVID-19 virus infection Suspect COVID-19, clinically stable, no respiratory distress. Adding Decadron, he will go to the testing site for COVID swab. Details given on self quarantine and mask wear. He should follow-up with me in a virtual visit in 2 weeks.  COVID test did come back positive.  He will quarantine for 14 days.  I discussed the above assessment and treatment plan with the patient. The patient was provided an opportunity to ask questions and all were answered. The patient agreed with the plan and demonstrated an understanding of the instructions.   The patient was advised to call back or seek an in-person evaluation if the symptoms worsen or if the condition fails to improve as anticipated.   I provided 25 minutes of non-face-to-face time during this encounter, 15 minutes of additional time was needed to gather information, review chart, records, communicate/coordinate with staff remotely, troubleshooting the multiple errors that we get every time when trying to do video calls through the electronic medical record, WebEx, and Doximity, restart the encounter multiple times due to instability of the software, as well as complete documentation.   ___________________________________________ Gwen Her. Dianah Field, M.D., ABFM., CAQSM. Primary Care and Sports Medicine [facility]  Adjunct Professor of [employer] [facility] [employer]

## 2018-10-04 LAB — NOVEL CORONAVIRUS, NAA: SARS-CoV-2, NAA: DETECTED — AB

## 2018-10-06 ENCOUNTER — Encounter: Payer: Self-pay | Admitting: Sports Medicine

## 2018-12-10 ENCOUNTER — Other Ambulatory Visit: Payer: Self-pay

## 2018-12-10 ENCOUNTER — Ambulatory Visit (INDEPENDENT_AMBULATORY_CARE_PROVIDER_SITE_OTHER): Payer: Commercial Managed Care - PPO | Admitting: Sports Medicine

## 2018-12-10 DIAGNOSIS — Z23 Encounter for immunization: Secondary | ICD-10-CM

## 2019-02-16 ENCOUNTER — Encounter: Payer: Commercial Managed Care - PPO | Admitting: Family Medicine

## 2019-02-19 ENCOUNTER — Ambulatory Visit: Payer: Commercial Managed Care - PPO | Admitting: Family Medicine

## 2019-02-20 ENCOUNTER — Other Ambulatory Visit: Payer: Self-pay

## 2019-02-20 ENCOUNTER — Ambulatory Visit (INDEPENDENT_AMBULATORY_CARE_PROVIDER_SITE_OTHER): Payer: Commercial Managed Care - PPO | Admitting: Family Medicine

## 2019-02-20 ENCOUNTER — Encounter: Payer: Self-pay | Admitting: Family Medicine

## 2019-02-20 DIAGNOSIS — H66002 Acute suppurative otitis media without spontaneous rupture of ear drum, left ear: Secondary | ICD-10-CM

## 2019-02-20 DIAGNOSIS — H6690 Otitis media, unspecified, unspecified ear: Secondary | ICD-10-CM | POA: Insufficient documentation

## 2019-02-20 MED ORDER — AMOXICILLIN-POT CLAVULANATE 875-125 MG PO TABS: 1.0000 | ORAL_TABLET | Freq: Two times a day (BID) | ORAL | 0 refills | Status: DC

## 2019-02-20 NOTE — Patient Instructions (Signed)
Otitis Media, Adult  Otitis media means that the middle ear is red and swollen (inflamed) and full of fluid. The condition usually goes away on its own. Follow these instructions at home:  Take over-the-counter and prescription medicines only as told by your doctor.  If you were prescribed an antibiotic medicine, take it as told by your doctor. Do not stop taking the antibiotic even if you start to feel better.  Keep all follow-up visits as told by your doctor. This is important. Contact a doctor if:  You have bleeding from your nose.  There is a lump on your neck.  You are not getting better in 5 days.  You feel worse instead of better. Get help right away if:  You have pain that is not helped with medicine.  You have swelling, redness, or pain around your ear.  You get a stiff neck.  You cannot move part of your face (paralyzed).  You notice that the bone behind your ear hurts when you touch it.  You get a very bad headache. Summary  Otitis media means that the middle ear is red, swollen, and full of fluid.  This condition usually goes away on its own. In some cases, treatment may be needed.  If you were prescribed an antibiotic medicine, take it as told by your doctor. This information is not intended to replace advice given to you by your health care provider. Make sure you discuss any questions you have with your health care provider. Document Revised: 12/14/2016 Document Reviewed: 01/23/2016 Elsevier Patient Education  2020 Elsevier Inc.  

## 2019-02-20 NOTE — Progress Notes (Signed)
Armando Bukhari - 24 y.o. male MRN [id number]  Date of birth: 04-15-1995  Subjective Chief Complaint  Patient presents with  . Otalgia    left- painful and will cause a H/A occasionally. Feels like fluid in it.    HPI .Kimo Bancroft is a 24 y.o. male with complaint of L ear pain.  Reports that symptoms started about 1 week ago.  Has fullness feeling to left ear.  Had previously as well and resolved on its own.  He denies fever, chills, nausea.    ROS:  A comprehensive ROS was completed and negative except as noted per HPI  No Known Allergies  Past Medical History:  Diagnosis Date  . Acute medial meniscal tear, right, sequela   . Asthma   . Chronic low back pain   . Chronic patellofemoral pain of right knee 02/22/2017  . L5 vertebral fracture (HCC)   . 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 KNEE ARTHROSCOPY WITH ANTERIOR CRUCIATE LIGAMENT (ACL) REPAIR Right 2014  . KNEE SURGERY     x 2     Social History   Socioeconomic History  . Marital status: Single    Spouse name: Not on file  . Number of children: Not on file  . Years of education: Not on file  . Highest education level: Not on file  Occupational History  . Not on file  Tobacco Use  . Smoking status: Never Smoker  . Smokeless tobacco: Never Used  Substance and Sexual Activity  . Alcohol use: No  . Drug use: Yes    Frequency: 7.0 times per week    Types: Marijuana  . Sexual activity: Not on file  Other Topics Concern  . Not on file  Social History Narrative  . Not on file   Social Determinants of Health   Financial Resource Strain:   . Difficulty of Paying Living Expenses: Not on file  Food Insecurity:   . Worried About Programme researcher, broadcasting/film/video in the Last Year: Not on file  . Ran Out of Food in the Last Year: Not on file  Transportation Needs:   . Lack of Transportation (Medical): Not on file  . Lack of Transportation  (Non-Medical): Not on file  Physical Activity:   . Days of Exercise per Week: Not on file  . Minutes of Exercise per Session: Not on file  Stress:   . Feeling of Stress : Not on file  Social Connections:   . Frequency of Communication with Friends and Family: Not on file  . Frequency of Social Gatherings with Friends and Family: Not on file  . Attends Religious Services: Not on file  . Active Member of Clubs or Organizations: Not on file  . Attends Banker Meetings: Not on file  . Marital Status: Not on file    Family History  Problem Relation Age of Onset  . HIV Maternal Grandfather   . Diabetes Maternal Grandfather   . Valvular heart disease Maternal Grandfather   . Hypertension Maternal Grandfather   . Hyperlipidemia Paternal Grandfather   . Hypertension Paternal Grandfather     Health Maintenance  Topic Date Due  . HIV Screening  10/25/2008  . TETANUS/TDAP  04/02/2017  . INFLUENZA VACCINE  Completed    ----------------------------------------------------------------------------------------------------------------------------------------------------------------------------------------------------------------- Physical Exam BP (!) 133/95   Pulse (!) 106   Temp 98 F (36.7 C) (Oral)   Wt 229 lb (103.9  kg)   SpO2 96%   BMI 33.82 kg/m   Physical Exam Constitutional:      Appearance: Normal appearance.  HENT:     Head: Normocephalic and atraumatic.     Right Ear: Tympanic membrane, ear canal and external ear normal.     Left Ear: Ear canal and external ear normal.     Ears:     Comments: TM inflamed with purulent appearing fluid posterior to TM.      Nose: No congestion.  Eyes:     General: No scleral icterus. Cardiovascular:     Rate and Rhythm: Normal rate and regular rhythm.  Pulmonary:     Effort: Pulmonary effort is normal.     Breath sounds: Normal breath sounds.  Musculoskeletal:     Cervical back: Neck supple.  Lymphadenopathy:      Cervical: No cervical adenopathy.  Skin:    General: Skin is warm and dry.  Neurological:     Mental Status: He is alert.  Psychiatric:        Mood and Affect: Mood normal.        Behavior: Behavior normal.     ------------------------------------------------------------------------------------------------------------------------------------------------------------------------------------------------------------------- Assessment and Plan  AOM (acute otitis media) Start rx for augmentin.  Ibuprofen and/or tylenol as needed for pain.  If recurrent issues may want to consider adding daily flonase.   Call for development of new or worsening symptoms.      This visit occurred during the SARS-CoV-2 public health emergency.  Safety protocols were in place, including screening questions prior to the visit, additional usage of staff PPE, and extensive cleaning of exam room while observing appropriate contact time as indicated for disinfecting solutions.

## 2019-02-20 NOTE — Assessment & Plan Note (Signed)
Start rx for augmentin.  Ibuprofen and/or tylenol as needed for pain.  If recurrent issues may want to consider adding daily flonase.   Call for development of new or worsening symptoms.

## 2019-03-05 ENCOUNTER — Encounter: Payer: Commercial Managed Care - PPO | Admitting: Family Medicine

## 2019-03-30 ENCOUNTER — Encounter: Payer: Self-pay | Admitting: Family Medicine

## 2019-03-30 ENCOUNTER — Other Ambulatory Visit: Payer: Self-pay

## 2019-03-30 ENCOUNTER — Ambulatory Visit (INDEPENDENT_AMBULATORY_CARE_PROVIDER_SITE_OTHER): Payer: Commercial Managed Care - PPO | Admitting: Family Medicine

## 2019-03-30 VITALS — BP 140/73 | HR 113 | Temp 98.0°F | Ht 70.0 in | Wt 243.0 lb

## 2019-03-30 DIAGNOSIS — Z13220 Encounter for screening for lipoid disorders: Secondary | ICD-10-CM | POA: Diagnosis not present

## 2019-03-30 DIAGNOSIS — Z0000 Encounter for general adult medical examination without abnormal findings: Secondary | ICD-10-CM | POA: Diagnosis not present

## 2019-03-30 NOTE — Patient Instructions (Signed)
Preventive Care 19-26 Years Old, Male Preventive care refers to lifestyle choices and visits with your health care provider that can promote health and wellness. This includes:  A yearly physical exam. This is also called an annual well check.  Regular dental and eye exams.  Immunizations.  Screening for certain conditions.  Healthy lifestyle choices, such as eating a healthy diet, getting regular exercise, not using drugs or products that contain nicotine and tobacco, and limiting alcohol use. What can I expect for my preventive care visit? Physical exam Your health care provider will check:  Height and weight. These may be used to calculate body mass index (BMI), which is a measurement that tells if you are at a healthy weight.  Heart rate and blood pressure.  Your skin for abnormal spots. Counseling Your health care provider may ask you questions about:  Alcohol, tobacco, and drug use.  Emotional well-being.  Home and relationship well-being.  Sexual activity.  Eating habits.  Work and work Statistician. What immunizations do I need?  Influenza (flu) vaccine  This is recommended every year. Tetanus, diphtheria, and pertussis (Tdap) vaccine  You may need a Td booster every 10 years. Varicella (chickenpox) vaccine  You may need this vaccine if you have not already been vaccinated. Human papillomavirus (HPV) vaccine  If recommended by your health care provider, you may need three doses over 6 months. Measles, mumps, and rubella (MMR) vaccine  You may need at least one dose of MMR. You may also need a second dose. Meningococcal conjugate (MenACWY) vaccine  One dose is recommended if you are 45-76 years old and a Market researcher living in a residence hall, or if you have one of several medical conditions. You may also need additional booster doses. Pneumococcal conjugate (PCV13) vaccine  You may need this if you have certain conditions and were not  previously vaccinated. Pneumococcal polysaccharide (PPSV23) vaccine  You may need one or two doses if you smoke cigarettes or if you have certain conditions. Hepatitis A vaccine  You may need this if you have certain conditions or if you travel or work in places where you may be exposed to hepatitis A. Hepatitis B vaccine  You may need this if you have certain conditions or if you travel or work in places where you may be exposed to hepatitis B. Haemophilus influenzae type b (Hib) vaccine  You may need this if you have certain risk factors. You may receive vaccines as individual doses or as more than one vaccine together in one shot (combination vaccines). Talk with your health care provider about the risks and benefits of combination vaccines. What tests do I need? Blood tests  Lipid and cholesterol levels. These may be checked every 5 years starting at age 17.  Hepatitis C test.  Hepatitis B test. Screening   Diabetes screening. This is done by checking your blood sugar (glucose) after you have not eaten for a while (fasting).  Sexually transmitted disease (STD) testing. Talk with your health care provider about your test results, treatment options, and if necessary, the need for more tests. Follow these instructions at home: Eating and drinking   Eat a diet that includes fresh fruits and vegetables, whole grains, lean protein, and low-fat dairy products.  Take vitamin and mineral supplements as recommended by your health care provider.  Do not drink alcohol if your health care provider tells you not to drink.  If you drink alcohol: ? Limit how much you have to 0-2  drinks a day. ? Be aware of how much alcohol is in your drink. In the [place] one drink equals one 12 oz bottle of beer (355 mL), one 5 oz glass of wine (148 mL), or one 1 oz glass of hard liquor (44 mL). Lifestyle  Take daily care of your teeth and gums.  Stay active. Exercise for at least 30 minutes on 5 or  more days each week.  Do not use any products that contain nicotine or tobacco, such as cigarettes, e-cigarettes, and chewing tobacco. If you need help quitting, ask your health care provider.  If you are sexually active, practice safe sex. Use a condom or other form of protection to prevent STIs (sexually transmitted infections). What's next?  Go to your health care provider once a year for a well check visit.  Ask your health care provider how often you should have your eyes and teeth checked.  Stay up to date on all vaccines. This information is not intended to replace advice given to you by your health care provider. Make sure you discuss any questions you have with your health care provider. Document Revised: 12/26/2017 Document Reviewed: 12/26/2017 Elsevier Patient Education  2020 [employer].

## 2019-03-30 NOTE — Assessment & Plan Note (Signed)
Well adult Orders Placed This Encounter  Procedures  . Lipid Profile  . COMPLETE METABOLIC PANEL WITH GFR  . CBC  Screenings: Lipid Immunizations: UTD Anticipatory Guidance/Risk factor reduction:  Work on weight loss and increasing activity. Additional recommendations per AVS.

## 2019-03-30 NOTE — Progress Notes (Signed)
Jack Schmitt - 23 y.o. male MRN [id number]  Date of birth: 08-29-1995  Subjective Chief Complaint  Patient presents with  . Annual Exam    HPI Jack Schmitt is a 24 y.o. male with history of asthma and chronic low back pain here today for annual exam.  He reports that he is doing well and has no new concerns today.  He requests to have updated labs today.    He does exercise, but infrequently.  His diet is fairly healthy.   He is a non-smoker.  Denies significant EtOH use.    Review of Systems  Constitutional: Negative for chills, fever, malaise/fatigue and weight loss.  HENT: Negative for congestion, ear pain and sore throat.   Eyes: Negative for blurred vision, double vision and pain.  Respiratory: Negative for cough and shortness of breath.   Cardiovascular: Negative for chest pain and palpitations.  Gastrointestinal: Negative for abdominal pain, blood in stool, constipation, heartburn and nausea.  Genitourinary: Negative for dysuria and urgency.  Musculoskeletal: Negative for joint pain and myalgias.  Neurological: Negative for dizziness and headaches.  Endo/Heme/Allergies: Does not bruise/bleed easily.  Psychiatric/Behavioral: Negative for depression. The patient is not nervous/anxious and does not have insomnia.     No Known Allergies  Past Medical History:  Diagnosis Date  . Acute medial meniscal tear, right, sequela   . Asthma   . Chronic low back pain   . Chronic patellofemoral pain of right knee 02/22/2017  . L5 vertebral fracture (HCC)   . 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 KNEE ARTHROSCOPY WITH ANTERIOR CRUCIATE LIGAMENT (ACL) REPAIR Right 2014  . KNEE SURGERY     x 2     Social History   Socioeconomic History  . Marital status: Single    Spouse name: Not on file  . Number of children: Not on file  . Years of education: Not on file  . Highest education level: Not on  file  Occupational History  . Not on file  Tobacco Use  . Smoking status: Never Smoker  . Smokeless tobacco: Never Used  Substance and Sexual Activity  . Alcohol use: No  . Drug use: Yes    Frequency: 7.0 times per week    Types: Marijuana  . Sexual activity: Not on file  Other Topics Concern  . Not on file  Social History Narrative  . Not on file   Social Determinants of Health   Financial Resource Strain:   . Difficulty of Paying Living Expenses:   Food Insecurity:   . Worried About Programme researcher, broadcasting/film/video in the Last Year:   . Barista in the Last Year:   Transportation Needs:   . Freight forwarder (Medical):   Marland Kitchen Lack of Transportation (Non-Medical):   Physical Activity:   . Days of Exercise per Week:   . Minutes of Exercise per Session:   Stress:   . Feeling of Stress :   Social Connections:   . Frequency of Communication with Friends and Family:   . Frequency of Social Gatherings with Friends and Family:   . Attends Religious Services:   . Active Member of Clubs or Organizations:   . Attends Banker Meetings:   Marland Kitchen Marital Status:     Family History  Problem Relation Age of Onset  . HIV Maternal Grandfather   . Diabetes Maternal Grandfather   . Valvular  heart disease Maternal Grandfather   . Hypertension Maternal Grandfather   . Hyperlipidemia Paternal Grandfather   . Hypertension Paternal Grandfather     Health Maintenance  Topic Date Due  . HIV Screening  Never done  . TETANUS/TDAP  04/02/2017  . INFLUENZA VACCINE  Completed     ----------------------------------------------------------------------------------------------------------------------------------------------------------------------------------------------------------------- Physical Exam BP 140/73   Pulse (!) 113   Temp 98 F (36.7 C) (Oral)   Ht 5\' 10"  (1.778 m)   Wt 243 lb (110.2 kg)   SpO2 97% Comment: on RA  BMI 34.87 kg/m   Physical Exam Constitutional:       General: He is not in acute distress.    Appearance: Normal appearance.  HENT:     Head: Normocephalic and atraumatic.     Right Ear: External ear normal.     Left Ear: External ear normal.  Eyes:     General: No scleral icterus. Neck:     Thyroid: No thyromegaly.  Cardiovascular:     Rate and Rhythm: Normal rate and regular rhythm.     Heart sounds: Normal heart sounds.  Pulmonary:     Effort: Pulmonary effort is normal.     Breath sounds: Normal breath sounds.  Abdominal:     General: Bowel sounds are normal. There is no distension.     Palpations: Abdomen is soft.     Tenderness: There is no abdominal tenderness. There is no guarding.  Musculoskeletal:     Cervical back: Normal range of motion.  Lymphadenopathy:     Cervical: No cervical adenopathy.  Skin:    General: Skin is warm and dry.     Findings: No rash.  Neurological:     General: No focal deficit present.     Mental Status: He is alert and oriented to person, place, and time.     Cranial Nerves: No cranial nerve deficit.     Motor: No abnormal muscle tone.  Psychiatric:        Mood and Affect: Mood normal.        Behavior: Behavior normal.     ------------------------------------------------------------------------------------------------------------------------------------------------------------------------------------------------------------------- Assessment and Plan  Well adult exam Well adult Orders Placed This Encounter  Procedures  . Lipid Profile  . COMPLETE METABOLIC PANEL WITH GFR  . CBC  Screenings: Lipid Immunizations: UTD Anticipatory Guidance/Risk factor reduction:  Work on weight loss and increasing activity. Additional recommendations per AVS.    No orders of the defined types were placed in this encounter.   No follow-ups on file.    This visit occurred during the SARS-CoV-2 public health emergency.  Safety protocols were in place, including screening questions prior to  the visit, additional usage of staff PPE, and extensive cleaning of exam room while observing appropriate contact time as indicated for disinfecting solutions.

## 2019-03-31 LAB — COMPLETE METABOLIC PANEL WITH GFR
AG Ratio: 1.7 (calc) (ref 1.0–2.5)
ALT: 18 U/L (ref 9–46)
AST: 11 U/L (ref 10–40)
Albumin: 4.3 g/dL (ref 3.6–5.1)
Alkaline phosphatase (APISO): 33 U/L — ABNORMAL LOW (ref 36–130)
BUN: 13 mg/dL (ref 7–25)
CO2: 28 mmol/L (ref 20–32)
Calcium: 9.5 mg/dL (ref 8.6–10.3)
Chloride: 102 mmol/L (ref 98–110)
Creat: 0.89 mg/dL (ref 0.60–1.35)
GFR, Est African American: 138 mL/min/{1.73_m2} (ref >=60)
GFR, Est Non African American: 119 mL/min/{1.73_m2} (ref >=60)
Globulin: 2.6 g/dL (calc) (ref 1.9–3.7)
Glucose, Bld: 91 mg/dL (ref 65–99)
Potassium: 3.7 mmol/L (ref 3.5–5.3)
Sodium: 139 mmol/L (ref 135–146)
Total Bilirubin: 0.5 mg/dL (ref 0.2–1.2)
Total Protein: 6.9 g/dL (ref 6.1–8.1)

## 2019-03-31 LAB — CBC
HCT: 44.9 % (ref 38.5–50.0)
Hemoglobin: 15.4 g/dL (ref 13.2–17.1)
MCH: 31.2 pg (ref 27.0–33.0)
MCHC: 34.3 g/dL (ref 32.0–36.0)
MCV: 90.9 fL (ref 80.0–100.0)
MPV: 10 fL (ref 7.5–12.5)
Platelets: 224 10*3/uL (ref 140–400)
RBC: 4.94 10*6/uL (ref 4.20–5.80)
RDW: 12.8 % (ref 11.0–15.0)
WBC: 7.4 10*3/uL (ref 3.8–10.8)

## 2019-03-31 LAB — LIPID PANEL
Cholesterol: 162 mg/dL (ref <200)
HDL: 47 mg/dL (ref >=40)
LDL Cholesterol (Calc): 94 mg/dL (calc)
Non-HDL Cholesterol (Calc): 115 mg/dL (calc) (ref <130)
Total CHOL/HDL Ratio: 3.4 (calc) (ref <5.0)
Triglycerides: 118 mg/dL (ref <150)

## 2019-04-01 ENCOUNTER — Encounter: Payer: Self-pay | Admitting: Family Medicine

## 2019-04-15 ENCOUNTER — Ambulatory Visit (INDEPENDENT_AMBULATORY_CARE_PROVIDER_SITE_OTHER): Payer: Commercial Managed Care - PPO | Admitting: Family Medicine

## 2019-04-15 ENCOUNTER — Other Ambulatory Visit: Payer: Self-pay

## 2019-04-15 DIAGNOSIS — Z23 Encounter for immunization: Secondary | ICD-10-CM

## 2019-04-15 NOTE — Progress Notes (Signed)
Patient is here for a Tdap injection for college. Denies past reaction to vaccine. Tdap injection to right deltoid with no apparent complications. Patient advised to call if needed. Print out given of immunization record.

## 2019-04-15 NOTE — Progress Notes (Signed)
Medical screening examination/treatment was performed by qualified clinical staff member and as supervising physician I was immediately available for consultation/collaboration. I have reviewed documentation and agree with assessment and plan.  Karin Griffith, DO  

## 2019-09-13 IMAGING — DX DG LUMBAR SPINE COMPLETE 4+V
5 series · 5 of 5 positions shown · non-contrast
Comparison: 04/26/2009.

CLINICAL DATA: Personal history of L5 fracture in 8454, presenting
with chronic low back pain since that time.

EXAM:
LUMBAR SPINE - COMPLETE 4+ VIEW

[l-spine ap]
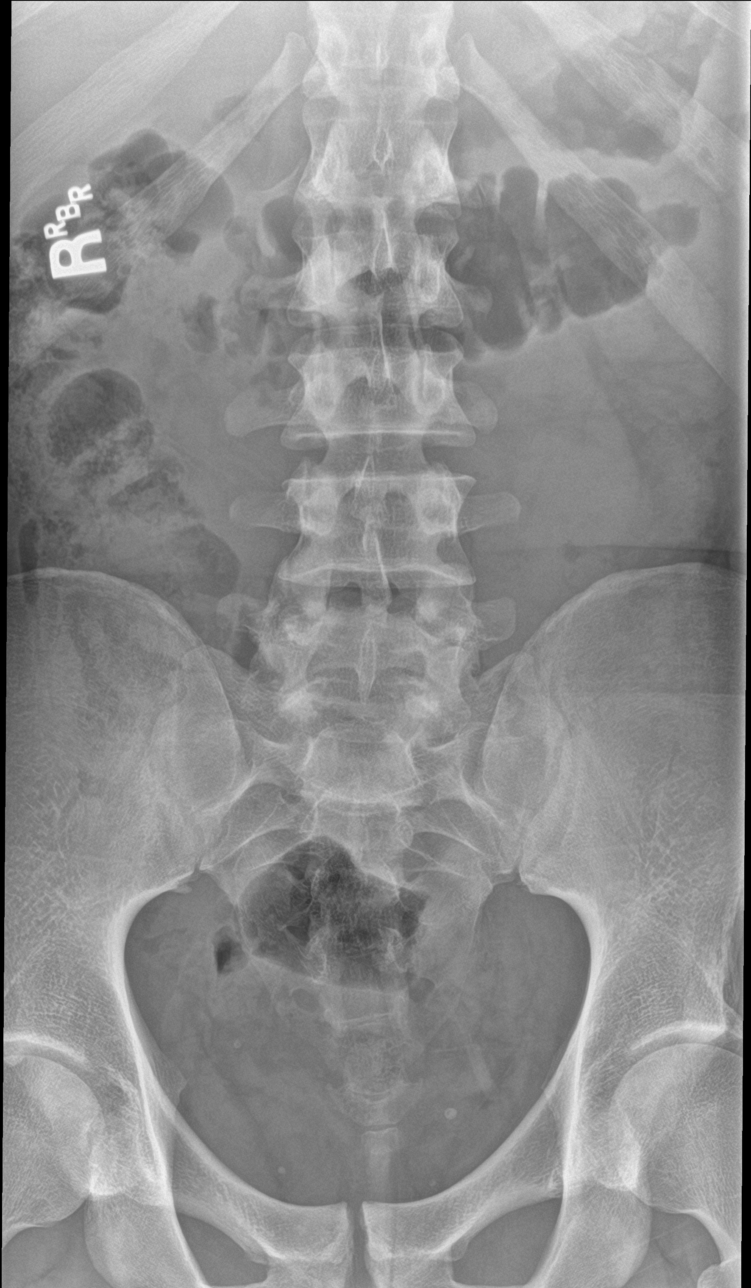

[l-spine obl (1 of 2)]
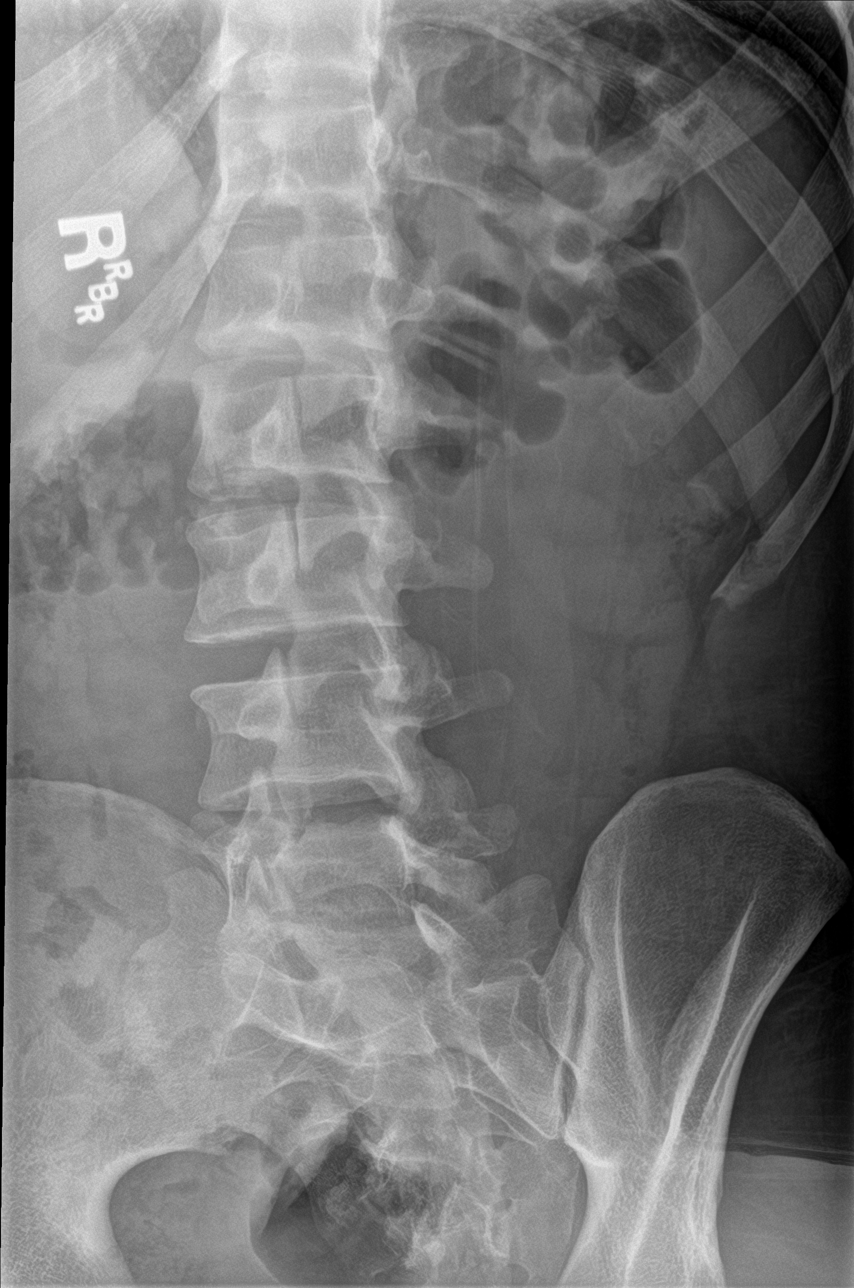

[l-spine obl (2 of 2)]
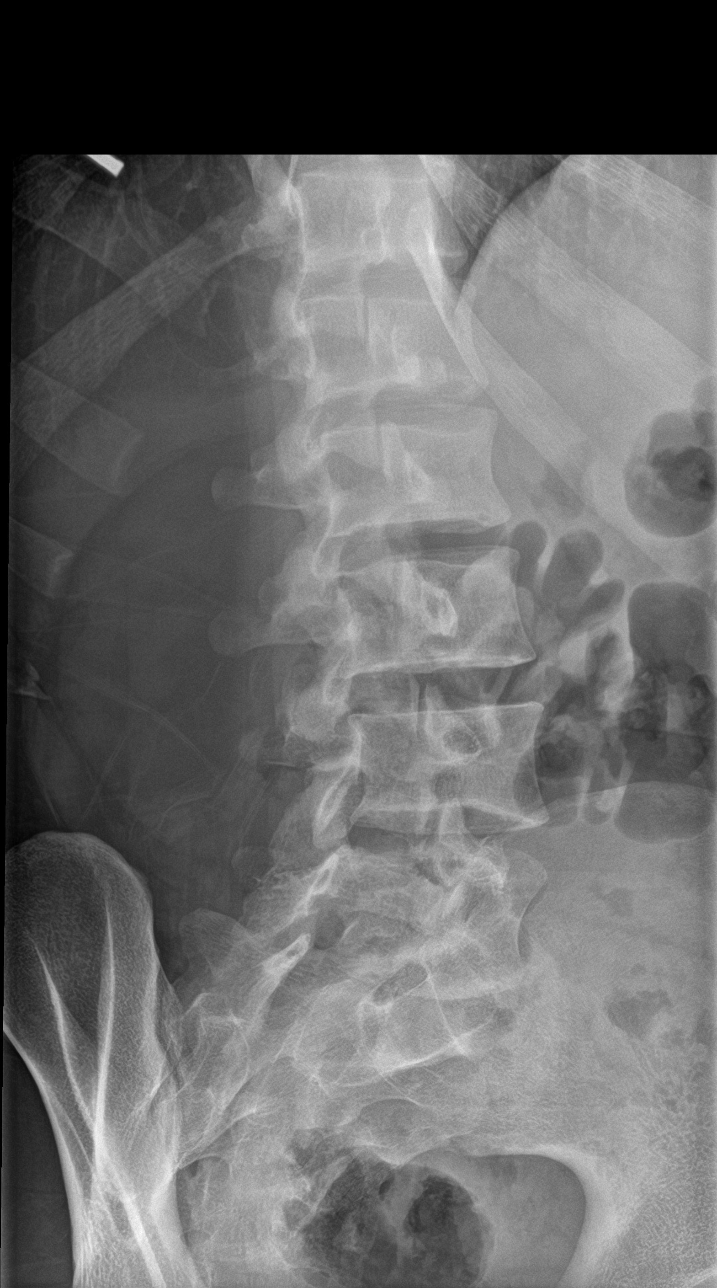

[l-spine lat]
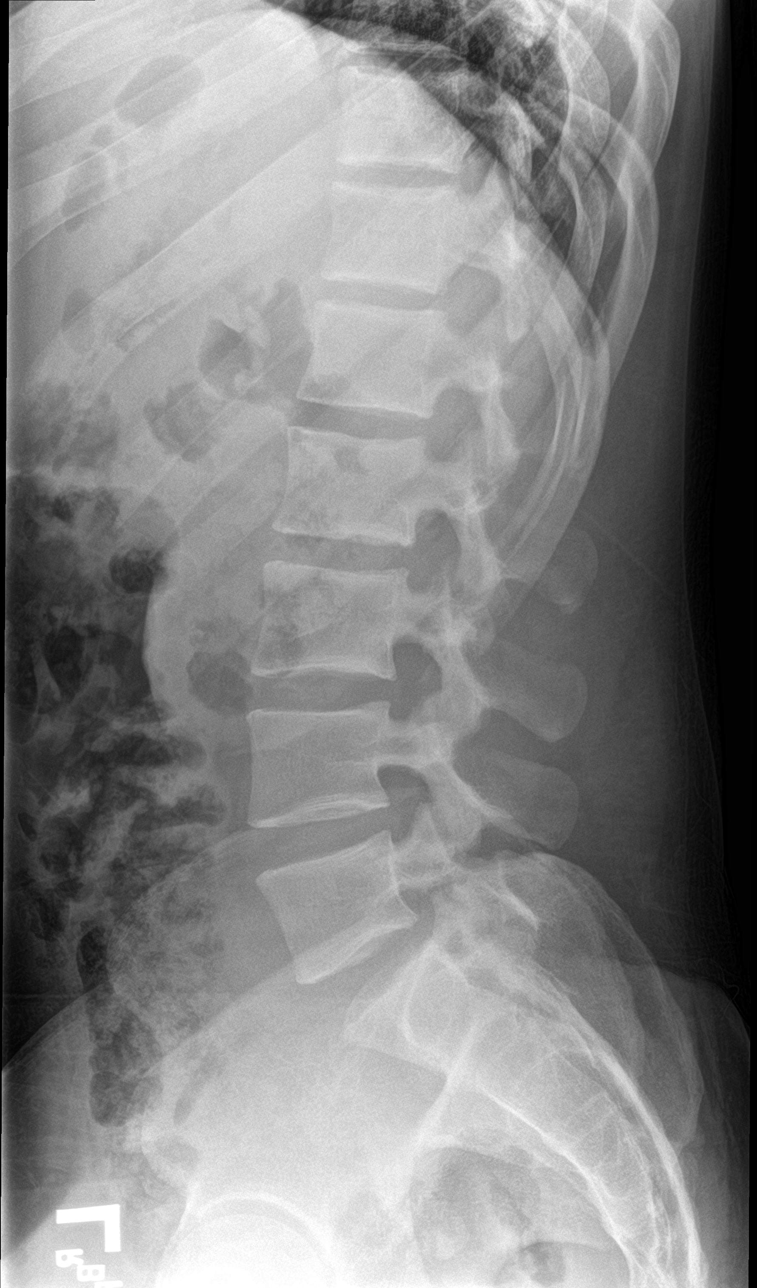

[l-spine spot]
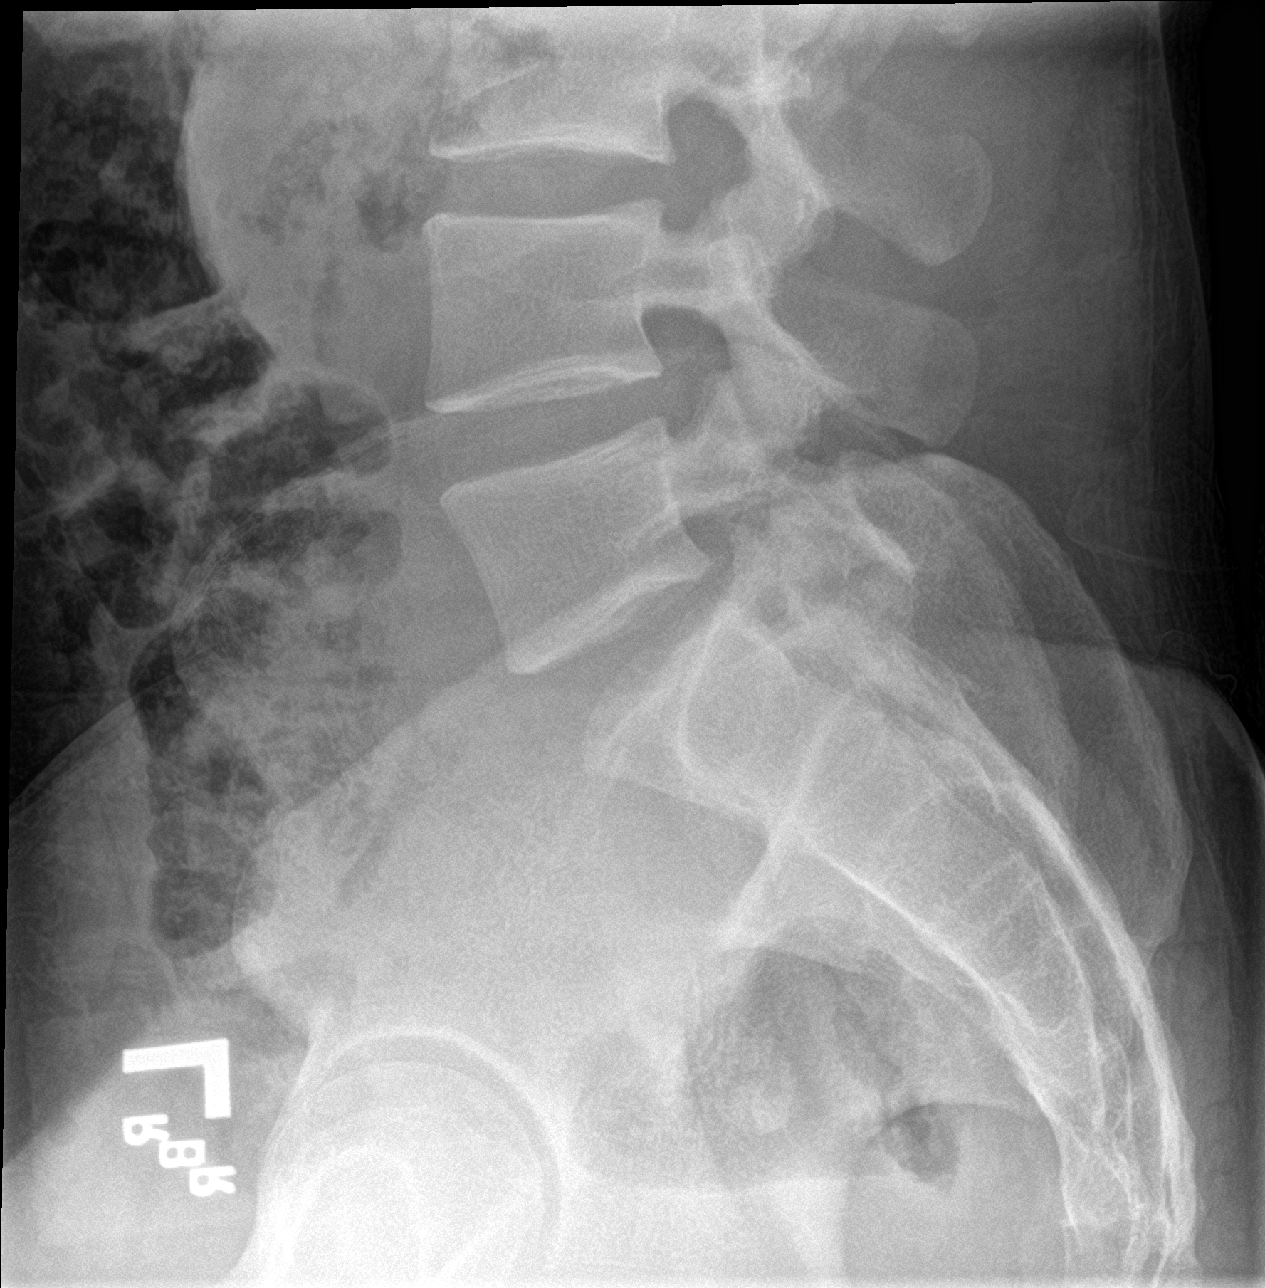

[5 of 5 positions shown; findings below may reference images not displayed]

FINDINGS: Five non rib-bearing lumbar vertebrae with anatomic alignment. L5
fracture is no longer visible. Well-preserved disk spaces. Bilateral
L5 pars defects without slip. No significant facet arthropathy. No
significant spondylosis. Visualized sacroiliac joints intact.
IMPRESSION: 1. No acute or subacute osseous abnormality.
2. Bilateral L5 pars defects without evidence of spondylolisthesis.
3. The L5 fracture as part of the given clinical information is no
longer visible.

## 2020-10-02 ENCOUNTER — Emergency Department
Admission: RE | Admit: 2020-10-02 | Discharge: 2020-10-02 | Disposition: A | Discharge status: Home / Self Care | Payer: Commercial Managed Care - PPO | Source: Ambulatory Visit | Attending: Family Medicine | Admitting: Family Medicine

## 2020-10-02 ENCOUNTER — Other Ambulatory Visit: Payer: Self-pay

## 2020-10-02 VITALS — BP 131/86 | HR 97 | Temp 99.4°F | Resp 18 | Ht 69.0 in | Wt 217.0 lb

## 2020-10-02 DIAGNOSIS — R112 Nausea with vomiting, unspecified: Secondary | ICD-10-CM

## 2020-10-02 DIAGNOSIS — U071 COVID-19: Secondary | ICD-10-CM

## 2020-10-02 MED ORDER — ONDANSETRON 8 MG PO TBDP: 8.0000 mg | ORAL_TABLET | Freq: Three times a day (TID) | ORAL | 0 refills | Status: AC | PRN

## 2020-10-02 NOTE — ED Provider Notes (Signed)
[facility]    CSN: [id number] Arrival date & time: 10/02/20  1318      History   Chief Complaint Chief Complaint  Patient presents with   Appointment    Sore throat    HPI Jack Schmitt is a 25 y.o. male.   HPI Patient has had respiratory symptoms nausea and vomiting for the last 2 to 3 days.  States he has not kept down any food for 36 hours.  He is taking sips of water so he does not get dehydrated.  Thinks he had a low-grade fever.  Did a COVID test at home today and it was positive. 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 Authorizing Provider  ondansetron (ZOFRAN ODT) 8 MG disintegrating tablet Take 1 tablet (8 mg total) by mouth every 8 (eight) hours as needed for nausea or vomiting. 10/02/20  Yes Eustace Moore, MD    Family History Family History  Problem Relation Age of Onset   HIV Maternal Grandfather    Diabetes Maternal Grandfather    Valvular heart disease Maternal Grandfather    Hypertension Maternal Grandfather    Hyperlipidemia Paternal Grandfather    Hypertension  Paternal Grandfather     Social History Social History   Tobacco Use   Smoking status: Never   Smokeless tobacco: Never  Vaping Use   Vaping Use: Never used  Substance Use Topics   Alcohol use: No   Drug use: Yes    Frequency: 7.0 times per week    Types: Marijuana     Allergies   Patient has no known allergies.   Review of Systems Review of Systems See HPI  Physical Exam Triage Vital Signs ED Triage Vitals  Enc Vitals Group     BP 10/02/20 1349 131/86     Pulse Rate 10/02/20 1349 97     Resp 10/02/20 1349 18     Temp 10/02/20 1349 99.4 F (37.4 C)     Temp Source 10/02/20 1349 Oral     SpO2 10/02/20 1349 93 %     Weight 10/02/20 1350 217 lb (98.4 kg)     Height 10/02/20 1350 5\' 9"  (1.753 m)     Head Circumference --      Peak Flow --      Pain Score 10/02/20 1350 0     Pain Loc --      Pain Edu? --      Excl. in GC? --    No  data found.  Updated Vital Signs BP 131/86 (BP Location: Right Arm)   Pulse 97   Temp 99.4 F (37.4 C) (Oral)   Resp 18   Ht 5\' 9"  (1.753 m)   Wt 98.4 kg   SpO2 93%   BMI 32.05 kg/m        Physical Exam Constitutional:      General: He is not in acute distress.    Appearance: Normal appearance. He is well-developed.  HENT:     Head: Normocephalic and atraumatic.     Mouth/Throat:     Mouth: Mucous membranes are moist.     Comments: Does not appear dehydrated Eyes:     Conjunctiva/sclera: Conjunctivae normal.     Pupils: Pupils are equal, round, and reactive to light.  Cardiovascular:     Rate and Rhythm: Normal rate and regular rhythm.     Heart sounds: Normal heart sounds.  Pulmonary:     Effort: Pulmonary effort is normal. No respiratory distress.     Breath sounds: Normal breath sounds. No wheezing or rales.  Abdominal:     General: There is no distension.     Palpations: Abdomen is soft.     Tenderness: There is no abdominal tenderness.  Musculoskeletal:        General: Normal range of motion.      Cervical back: Normal range of motion.  Skin:    General: Skin is warm and dry.  Neurological:     Mental Status: He is alert.  Psychiatric:        Mood and Affect: Mood normal.        Behavior: Behavior normal.     UC Treatments / Results  Labs (all labs ordered are listed, but only abnormal results are displayed) Labs Reviewed - No data to display  EKG   Radiology No results found.  Procedures Procedures (including critical care time)  Medications Ordered in UC Medications - No data to display  Initial Impression / Assessment and Plan / UC Course  I have reviewed the triage vital signs and the nursing notes.  Pertinent labs & imaging results that were available during my care of the patient were reviewed by me and considered in my medical decision making (see chart for details).     Reviewed CDC guidelines for quarantine and mask wearing. Reviewed prevention of dehydration Final Clinical Impressions(s) / UC Diagnoses   Final diagnoses:  COVID-19  Nausea and vomiting, intractability of vomiting not specified, unspecified vomiting type     Discharge Instructions      Take Zofran for vomiting Continue to push liquids May take over-the-counter cough and cold medicines as needed Quarantine for 5 days as we discussed   ED Prescriptions     Medication Sig Dispense Auth. Provider   ondansetron (ZOFRAN ODT) 8 MG disintegrating tablet Take 1 tablet (8 mg total) by mouth every 8 (eight) hours as needed for nausea or vomiting. 20 tablet , MD      PDMP not reviewed this encounter.   Eustace Moore, MD 10/02/20 [phone]

## 2020-10-02 NOTE — Discharge Instructions (Signed)
Take Zofran for vomiting Continue to push liquids May take over-the-counter cough and cold medicines as needed Quarantine for 5 days as we discussed

## 2020-10-02 NOTE — ED Triage Notes (Signed)
Patient states he began with fever, vomiting, sore throat, has not eaten in 36 hours.  Sx's started Friday evening.  Patient has taken Day and Nyquil, Ibuprofen.  Patient tested positive for COVID for today.

## 2020-12-23 ENCOUNTER — Ambulatory Visit: Payer: BC Managed Care – PPO | Admitting: Family Medicine

## 2020-12-26 ENCOUNTER — Ambulatory Visit: Payer: BC Managed Care – PPO | Admitting: Family Medicine

## 2020-12-30 ENCOUNTER — Ambulatory Visit: Payer: BC Managed Care – PPO | Admitting: Family Medicine
